# Patient Record
Sex: Female | Born: 1990 | Hispanic: Yes | Marital: Married | Smoking: Never smoker
Health system: Southern US, Community
[De-identification: ages and names within clinical notes are randomized; demographics above are authoritative.]

---
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
## 2009-04-12 ENCOUNTER — Ambulatory Visit: Payer: Self-pay | Admitting: Gastroenterology

## 2009-05-10 ENCOUNTER — Ambulatory Visit: Payer: Self-pay | Admitting: Gastroenterology

## 2013-07-01 ENCOUNTER — Emergency Department: Payer: Self-pay | Admitting: Emergency Medicine

## 2013-07-01 LAB — URINALYSIS, COMPLETE
Bilirubin,UR: NEGATIVE
Glucose,UR: NEGATIVE mg/dL (ref 0–75)
KETONE: NEGATIVE
LEUKOCYTE ESTERASE: NEGATIVE
NITRITE: NEGATIVE
PH: 7 (ref 4.5–8.0)
PROTEIN: NEGATIVE
RBC,UR: 3 /HPF (ref 0–5)
SPECIFIC GRAVITY: 1.02 (ref 1.003–1.030)
Squamous Epithelial: 6
WBC UR: 3 /HPF (ref 0–5)

## 2013-07-01 LAB — PREGNANCY, URINE: Pregnancy Test, Urine: NEGATIVE m[IU]/mL

## 2013-07-01 LAB — GC/CHLAMYDIA PROBE AMP

## 2013-07-01 LAB — WET PREP, GENITAL

## 2013-07-03 LAB — URINE CULTURE

## 2014-08-30 ENCOUNTER — Other Ambulatory Visit: Payer: Self-pay

## 2014-08-30 ENCOUNTER — Emergency Department
Admission: EM | Admit: 2014-08-30 | Discharge: 2014-08-30 | Disposition: A | Discharge status: Home / Self Care | Payer: Medicaid Other | Attending: Emergency Medicine | Admitting: Emergency Medicine

## 2014-08-30 ENCOUNTER — Emergency Department
Admission: EM | Admit: 2014-08-30 | Discharge: 2014-08-30 | Disposition: A | Discharge status: Home / Self Care | Payer: Self-pay

## 2014-08-30 ENCOUNTER — Encounter: Payer: Self-pay | Admitting: Emergency Medicine

## 2014-08-30 DIAGNOSIS — Z3202 Encounter for pregnancy test, result negative: Secondary | ICD-10-CM | POA: Insufficient documentation

## 2014-08-30 DIAGNOSIS — R55 Syncope and collapse: Secondary | ICD-10-CM | POA: Insufficient documentation

## 2014-08-30 LAB — URINALYSIS COMPLETE WITH MICROSCOPIC (ARMC ONLY)
Bacteria, UA: NONE SEEN
Bilirubin Urine: NEGATIVE
GLUCOSE, UA: NEGATIVE mg/dL
Nitrite: NEGATIVE
PROTEIN: NEGATIVE mg/dL
SPECIFIC GRAVITY, URINE: 1.012 (ref 1.005–1.030)
pH: 5 (ref 5.0–8.0)

## 2014-08-30 LAB — BASIC METABOLIC PANEL
Anion gap: 7 (ref 5–15)
BUN: 12 mg/dL (ref 6–20)
CO2: 26 mmol/L (ref 22–32)
Calcium: 9.1 mg/dL (ref 8.9–10.3)
Chloride: 104 mmol/L (ref 101–111)
Creatinine, Ser: 0.48 mg/dL (ref 0.44–1.00)
GLUCOSE: 97 mg/dL (ref 65–99)
POTASSIUM: 3.7 mmol/L (ref 3.5–5.1)
Sodium: 137 mmol/L (ref 135–145)

## 2014-08-30 LAB — CBC
HEMATOCRIT: 41 % (ref 35.0–47.0)
Hemoglobin: 13.8 g/dL (ref 12.0–16.0)
MCH: 31 pg (ref 26.0–34.0)
MCHC: 33.6 g/dL (ref 32.0–36.0)
MCV: 92.4 fL (ref 80.0–100.0)
PLATELETS: 270 10*3/uL (ref 150–440)
RBC: 4.44 MIL/uL (ref 3.80–5.20)
RDW: 13.3 % (ref 11.5–14.5)
WBC: 8.9 10*3/uL (ref 3.6–11.0)

## 2014-08-30 LAB — POCT PREGNANCY, URINE: Preg Test, Ur: NEGATIVE

## 2014-08-30 NOTE — ED Notes (Signed)
Discharge instructions reviewed with patient. Questions fielded by this RN. Patient verbalizes understanding of instructions. Patient discharged home in stable condition per Aimee Bowers. No acute distress noted at time of discharge.

## 2014-08-30 NOTE — ED Provider Notes (Signed)
[facility] Emergency Department Provider Note    ____________________________________________  Time seen: Approximately 1900  I have reviewed the triage vital signs and the nursing notes.   HISTORY  Chief Complaint Near Syncope       HPI Aimee Bowers is a 24 y.o. female with no significant past medical history who presents with an episode of near syncope and preceding symptoms that include lightheadedness, diaphoresis, nausea, and mild headache. She had been out in the heat for some period of time and had been seated. She stood up to get out of a car when she felt the rapid onset of symptoms. She reports that she felt better after having some cold water splashed in her face. She did not completely lose consciousness. She feels back to normal at this time except for a mild headache. She did not vomit, and she denies chest pain, shortness of breath, abdominal pain, and recent dysuria.     History reviewed. No pertinent past medical history.  There are no active problems to display for this patient.   History reviewed. No pertinent past surgical history.  No current outpatient prescriptions on file.  Allergies Review of patient's allergies indicates not on file.  History reviewed. No pertinent family history.  Social History History  Substance Use Topics  . Smoking status: Never Smoker   . Smokeless tobacco: Not on file  . Alcohol Use: No    Review of Systems  Constitutional: Negative for fever. Eyes: Negative for visual changes. ENT: Negative for sore throat. Cardiovascular: Negative for chest pain. Respiratory: Negative for shortness of breath. Gastrointestinal: Mild nausea, no vomiting. Nausea has resolved  Genitourinary: Negative for dysuria. Musculoskeletal: Negative for back pain. Skin: Negative for rash. Neurological: Mild headache. No focal weakness or numbness.   10-point ROS otherwise  negative.  ____________________________________________   PHYSICAL EXAM:  VITAL SIGNS: ED Triage Vitals  Enc Vitals Group     BP 08/30/14 1744 125/80 mmHg     Pulse Rate 08/30/14 1744 80     Resp 08/30/14 1744 18     Temp 08/30/14 1744 98.2 F (36.8 C)     Temp Source 08/30/14 1744 Oral     SpO2 08/30/14 1828 100 %     Weight 08/30/14 1744 128 lb (58.06 kg)     Height 08/30/14 1744 5\' 2"  (1.575 m)     Head Cir --      Peak Flow --      Pain Score 08/30/14 1747 8     Pain Loc --      Pain Edu? --      Excl. in GC? --      Constitutional: Alert and oriented. Well appearing and in no distress. Eyes: Conjunctivae are normal. PERRL. Normal extraocular movements. ENT   Head: Normocephalic and atraumatic.   Nose: No congestion/rhinnorhea.   Mouth/Throat: Mucous membranes are moist.   Neck: No stridor. Hematological/Lymphatic/Immunilogical: No cervical lymphadenopathy. Cardiovascular: Normal rate, regular rhythm. Normal and symmetric distal pulses are present in all extremities. No murmurs, rubs, or gallops. Respiratory: Normal respiratory effort without tachypnea nor retractions. Breath sounds are clear and equal bilaterally. No wheezes/rales/rhonchi. Gastrointestinal: Soft and nontender. No distention. No abdominal bruits. There is no CVA tenderness. Genitourinary: Deferred Musculoskeletal: Nontender with normal range of motion in all extremities. No joint effusions.  No lower extremity tenderness nor edema. Neurologic:  Normal speech and language. No gross focal neurologic deficits are appreciated. Speech is normal. No gait instability. Skin:  Skin  is warm, dry and intact. No rash noted. Psychiatric: Mood and affect are normal. Speech and behavior are normal. Patient exhibits appropriate insight and judgment.  ____________________________________________    LABS (pertinent positives/negatives)  Reviewed and unremarkable. Urine pregnancy is  negative  ____________________________________________   EKG   Date: 08/30/2014  Rate: 73  Rhythm: normal sinus rhythm  QRS Axis: normal  Intervals: normal  ST/T Wave abnormalities: normal  Conduction Disutrbances: none  Narrative Interpretation: unremarkable      ____________________________________________    RADIOLOGY  Not indicated  ____________________________________________   PROCEDURES  Procedure(s) performed: None  Critical Care performed: No  ____________________________________________   INITIAL IMPRESSION / ASSESSMENT AND PLAN / ED COURSE  Pertinent labs & imaging results that were available during my care of the patient were reviewed by me and considered in my medical decision making (see chart for details).  The patient is in no acute distress, well-appearing, and back to baseline except for a mild headache. She had a near syncopal episode that was likely orthostatic in nature. She has no sign of infection or other emergent medical condition. I discussed the patient's results with her and her significant other and she will follow-up with her regular doctor within the next couple of days. I recommended drinking plenty of by mouth fluids for rehydration, but there is no indication for IV hydration at this time. I gave my usual and customary return precautions.  ____________________________________________   FINAL CLINICAL IMPRESSION(S) / ED DIAGNOSES  Final diagnoses:  Near syncope     Loleta Rose, MD 08/30/14 2000

## 2014-08-30 NOTE — ED Notes (Signed)
Pt states she had sudden onset dizziness, diaphoresis with nausea and HA that started this afternoon..[facility]..

## 2014-08-30 NOTE — Discharge Instructions (Signed)
Near-Syncope Near-syncope (commonly known as near fainting) is sudden weakness, dizziness, or feeling like you might pass out. During an episode of near-syncope, you may also develop pale skin, have tunnel vision, or feel sick to your stomach (nauseous). Near-syncope may occur when getting up after sitting or while standing for a long time. It is caused by a sudden decrease in blood flow to the brain. This decrease can result from various causes or triggers, most of which are not serious. However, because near-syncope can sometimes be a sign of something serious, a medical evaluation is required. The specific cause is often not determined. HOME CARE INSTRUCTIONS  Monitor your condition for any changes. The following actions may help to alleviate any discomfort you are experiencing:  Have someone stay with you until you feel stable.  Lie down right away and prop your feet up if you start feeling like you might faint. Breathe deeply and steadily. Wait until all the symptoms have passed. Most of these episodes last only a few minutes. You may feel tired for several hours.   Drink enough fluids to keep your urine clear or pale yellow.   If you are taking blood pressure or heart medicine, get up slowly when seated or lying down. Take several minutes to sit and then stand. This can reduce dizziness.  Follow up with your health care provider as directed. SEEK IMMEDIATE MEDICAL CARE IF:   You have a severe headache.   You have unusual pain in the chest, abdomen, or back.   You are bleeding from the mouth or rectum, or you have black or tarry stool.   You have an irregular or very fast heartbeat.   You have repeated fainting or have seizure-like jerking during an episode.   You faint when sitting or lying down.   You have confusion.   You have difficulty walking.   You have severe weakness.   You have vision problems.  MAKE SURE YOU:   Understand these instructions.  Will  watch your condition.  Will get help right away if you are not doing well or get worse. Document Released: 04/16/2005 Document Revised: 04/21/2013 Document Reviewed: 09/19/2012 Exit[employer] Patient Information 2015 [employer]. This information is not intended to replace advice given to you by your health care provider. Make sure you discuss any questions you have with your health care provider.  

## 2015-07-01 ENCOUNTER — Other Ambulatory Visit: Payer: Self-pay | Admitting: Neurology

## 2015-07-01 DIAGNOSIS — R292 Abnormal reflex: Secondary | ICD-10-CM

## 2015-07-01 DIAGNOSIS — R200 Anesthesia of skin: Secondary | ICD-10-CM

## 2015-07-01 DIAGNOSIS — M79601 Pain in right arm: Secondary | ICD-10-CM

## 2015-07-01 DIAGNOSIS — M79602 Pain in left arm: Secondary | ICD-10-CM

## 2015-07-01 DIAGNOSIS — G8929 Other chronic pain: Secondary | ICD-10-CM

## 2015-07-01 DIAGNOSIS — R202 Paresthesia of skin: Secondary | ICD-10-CM

## 2015-07-01 DIAGNOSIS — M542 Cervicalgia: Secondary | ICD-10-CM

## 2015-07-21 ENCOUNTER — Ambulatory Visit: Payer: 59

## 2015-08-01 ENCOUNTER — Ambulatory Visit (HOSPITAL_COMMUNITY): Admission: RE | Admit: 2015-08-01 | Payer: 59 | Source: Ambulatory Visit

## 2015-08-01 ENCOUNTER — Ambulatory Visit (HOSPITAL_COMMUNITY): Payer: 59

## 2015-08-02 ENCOUNTER — Ambulatory Visit (HOSPITAL_COMMUNITY)
Admission: RE | Admit: 2015-08-02 | Discharge: 2015-08-02 | Disposition: A | Discharge status: Home / Self Care | Payer: 59 | Source: Ambulatory Visit | Attending: Neurology | Admitting: Neurology

## 2015-08-02 DIAGNOSIS — R292 Abnormal reflex: Secondary | ICD-10-CM | POA: Diagnosis not present

## 2015-08-02 DIAGNOSIS — R202 Paresthesia of skin: Secondary | ICD-10-CM

## 2015-08-02 DIAGNOSIS — G8929 Other chronic pain: Secondary | ICD-10-CM | POA: Insufficient documentation

## 2015-08-02 DIAGNOSIS — M542 Cervicalgia: Secondary | ICD-10-CM | POA: Diagnosis not present

## 2015-08-02 DIAGNOSIS — M50222 Other cervical disc displacement at C5-C6 level: Secondary | ICD-10-CM | POA: Insufficient documentation

## 2015-08-02 DIAGNOSIS — M79601 Pain in right arm: Secondary | ICD-10-CM | POA: Diagnosis present

## 2015-08-02 DIAGNOSIS — R200 Anesthesia of skin: Secondary | ICD-10-CM

## 2015-08-02 DIAGNOSIS — M79602 Pain in left arm: Secondary | ICD-10-CM | POA: Diagnosis present

## 2015-08-02 MED ORDER — GADOBENATE DIMEGLUMINE 529 MG/ML IV SOLN
10.0000 mL | Freq: Once | INTRAVENOUS | Status: AC | PRN
  Administered 2015-08-02: 10 mL via INTRAVENOUS

## 2016-01-08 IMAGING — CT CT STONE STUDY
1 of 4 series · 4 of 46 positions shown, 9 images · non-contrast
Comparison: None.

CLINICAL DATA: Frequent UTIs and flank pain.  Left flank pain.

EXAM:
CT ABDOMEN AND PELVIS WITHOUT CONTRAST
TECHNIQUE: Multidetector CT imaging of the abdomen and pelvis was performed
following the standard protocol without IV contrast.

[Series 4: lung windows · axial · 0.68mm/px · z∈[-650,-590]mm · 4 of 20 slices shown, 9 images]
[im 4/20  soft-tissue]
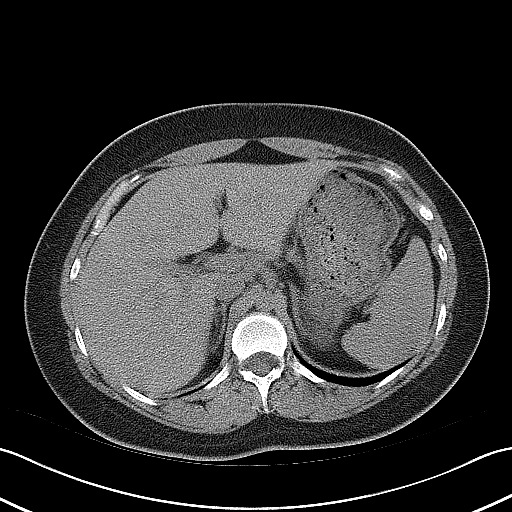
[im 4/20  lung]
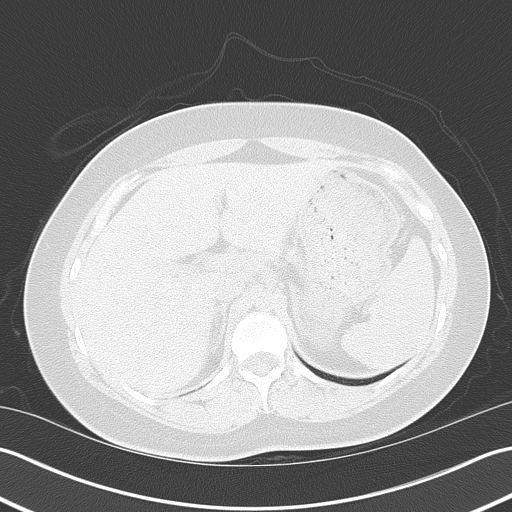
[im 4/20  bone]
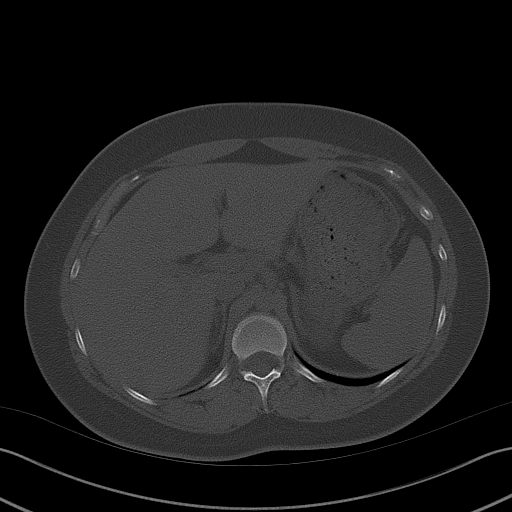
[im 8/20  soft-tissue]
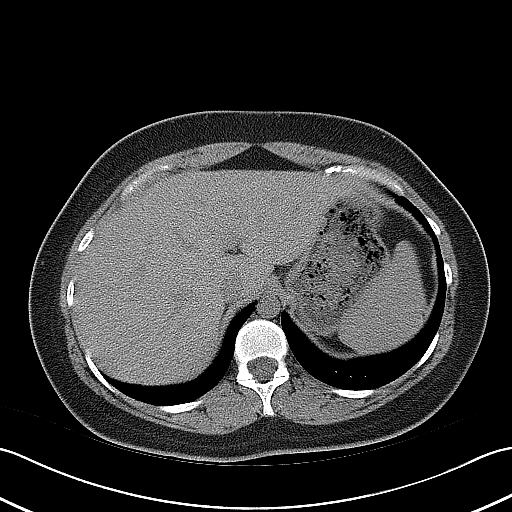
[im 8/20  lung]
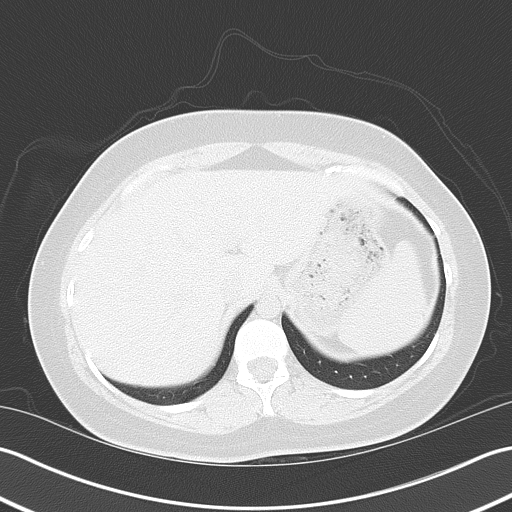
[im 12/20  soft-tissue]
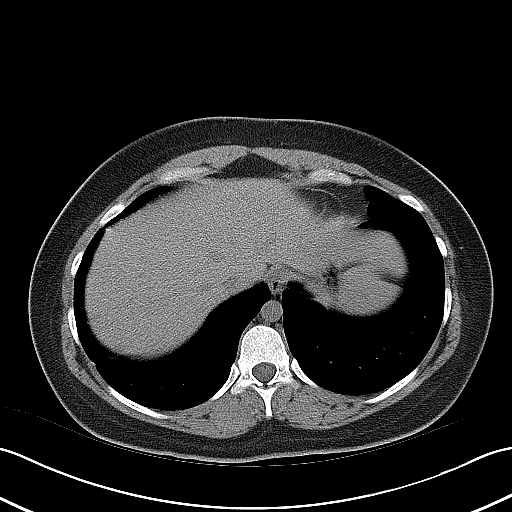
[im 12/20  lung]
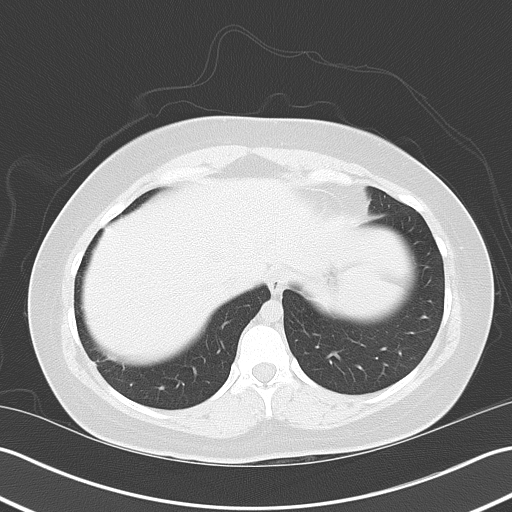
[im 16/20  soft-tissue]
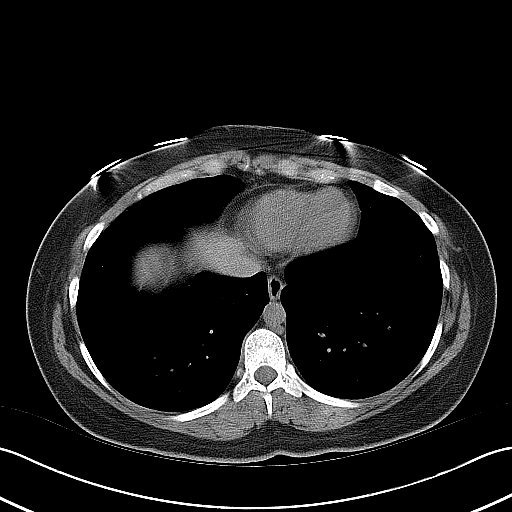
[im 16/20  lung]
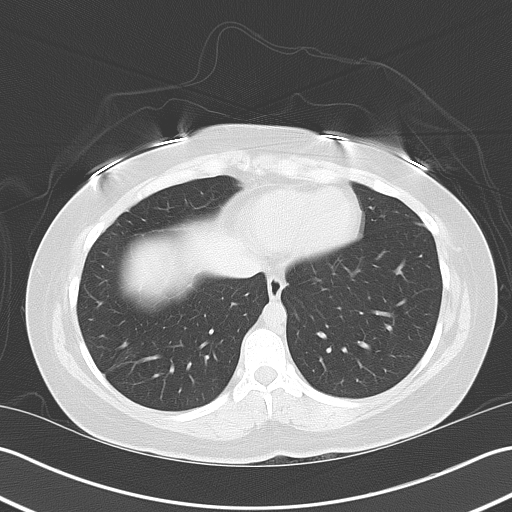

[4 of 46 positions shown; findings below may reference images not displayed]

FINDINGS: BODY WALL: Unremarkable.

LOWER CHEST: Unremarkable.

ABDOMEN/PELVIS:

Liver: No focal abnormality.

Biliary: No evidence of biliary obstruction or stone.

Pancreas: Unremarkable.

Spleen: Unremarkable.

Adrenals: Unremarkable.

Kidneys and ureters: No hydronephrosis or stone.

Bladder: Unremarkable.

Reproductive: Unremarkable.

Bowel: No obstruction. Normal appendix.

Retroperitoneum: No mass or adenopathy.

Peritoneum: No free fluid or gas.

Vascular: No acute abnormality.

OSSEOUS: No acute abnormalities.
IMPRESSION: No hydronephrosis, nephrolithiasis, or other abnormality to explain
left flank pain.

## 2017-06-10 ENCOUNTER — Other Ambulatory Visit: Payer: Self-pay | Admitting: Family Medicine

## 2017-06-10 DIAGNOSIS — R102 Pelvic and perineal pain: Secondary | ICD-10-CM

## 2017-06-12 ENCOUNTER — Ambulatory Visit
Admission: RE | Admit: 2017-06-12 | Discharge: 2017-06-12 | Disposition: A | Discharge status: Home / Self Care | Payer: 59 | Source: Ambulatory Visit | Attending: Family Medicine | Admitting: Family Medicine

## 2017-06-12 DIAGNOSIS — R102 Pelvic and perineal pain: Secondary | ICD-10-CM | POA: Insufficient documentation

## 2017-06-12 DIAGNOSIS — R188 Other ascites: Secondary | ICD-10-CM | POA: Diagnosis not present

## 2017-06-13 ENCOUNTER — Other Ambulatory Visit: Payer: Self-pay

## 2017-09-06 ENCOUNTER — Other Ambulatory Visit: Payer: Self-pay | Admitting: Family Medicine

## 2017-09-06 DIAGNOSIS — Z3201 Encounter for pregnancy test, result positive: Secondary | ICD-10-CM

## 2017-09-12 ENCOUNTER — Ambulatory Visit: Payer: 59

## 2017-09-18 ENCOUNTER — Other Ambulatory Visit: Payer: Self-pay | Admitting: Family Medicine

## 2017-09-18 DIAGNOSIS — Z3201 Encounter for pregnancy test, result positive: Secondary | ICD-10-CM

## 2017-09-19 ENCOUNTER — Other Ambulatory Visit: Payer: Self-pay | Admitting: Family Medicine

## 2017-09-19 ENCOUNTER — Ambulatory Visit
Admission: RE | Admit: 2017-09-19 | Discharge: 2017-09-19 | Disposition: A | Discharge status: Home / Self Care | Payer: 59 | Source: Ambulatory Visit | Attending: Family Medicine | Admitting: Family Medicine

## 2017-09-19 DIAGNOSIS — Z3A01 Less than 8 weeks gestation of pregnancy: Secondary | ICD-10-CM | POA: Diagnosis not present

## 2017-09-19 DIAGNOSIS — Z3201 Encounter for pregnancy test, result positive: Secondary | ICD-10-CM | POA: Insufficient documentation

## 2018-02-03 ENCOUNTER — Emergency Department: Payer: 59

## 2018-02-03 ENCOUNTER — Other Ambulatory Visit: Payer: Self-pay

## 2018-02-03 ENCOUNTER — Emergency Department
Admission: EM | Admit: 2018-02-03 | Discharge: 2018-02-03 | Disposition: A | Discharge status: Home / Self Care | Payer: 59 | Attending: Emergency Medicine | Admitting: Emergency Medicine

## 2018-02-03 DIAGNOSIS — O26899 Other specified pregnancy related conditions, unspecified trimester: Secondary | ICD-10-CM

## 2018-02-03 DIAGNOSIS — B9689 Other specified bacterial agents as the cause of diseases classified elsewhere: Secondary | ICD-10-CM

## 2018-02-03 DIAGNOSIS — R110 Nausea: Secondary | ICD-10-CM | POA: Diagnosis not present

## 2018-02-03 DIAGNOSIS — O9989 Other specified diseases and conditions complicating pregnancy, childbirth and the puerperium: Secondary | ICD-10-CM | POA: Diagnosis not present

## 2018-02-03 DIAGNOSIS — R197 Diarrhea, unspecified: Secondary | ICD-10-CM | POA: Diagnosis not present

## 2018-02-03 DIAGNOSIS — R109 Unspecified abdominal pain: Secondary | ICD-10-CM

## 2018-02-03 DIAGNOSIS — O23591 Infection of other part of genital tract in pregnancy, first trimester: Secondary | ICD-10-CM | POA: Diagnosis not present

## 2018-02-03 DIAGNOSIS — N760 Acute vaginitis: Secondary | ICD-10-CM

## 2018-02-03 LAB — CBC
HCT: 40.7 % (ref 35.0–47.0)
HEMOGLOBIN: 13.9 g/dL (ref 12.0–16.0)
MCH: 31.1 pg (ref 26.0–34.0)
MCHC: 34.1 g/dL (ref 32.0–36.0)
MCV: 91.3 fL (ref 80.0–100.0)
Platelets: 298 10*3/uL (ref 150–440)
RBC: 4.47 MIL/uL (ref 3.80–5.20)
RDW: 12.6 % (ref 11.5–14.5)
WBC: 16 10*3/uL — ABNORMAL HIGH (ref 3.6–11.0)

## 2018-02-03 LAB — URINALYSIS, COMPLETE (UACMP) WITH MICROSCOPIC
BACTERIA UA: NONE SEEN
Bilirubin Urine: NEGATIVE
Glucose, UA: NEGATIVE mg/dL
HGB URINE DIPSTICK: NEGATIVE
Ketones, ur: NEGATIVE mg/dL
Leukocytes, UA: NEGATIVE
Nitrite: NEGATIVE
PROTEIN: NEGATIVE mg/dL
Specific Gravity, Urine: 1.028 (ref 1.005–1.030)
pH: 5 (ref 5.0–8.0)

## 2018-02-03 LAB — COMPREHENSIVE METABOLIC PANEL
ALT: 17 U/L (ref 0–44)
AST: 21 U/L (ref 15–41)
Albumin: 3.9 g/dL (ref 3.5–5.0)
Alkaline Phosphatase: 46 U/L (ref 38–126)
Anion gap: 8 (ref 5–15)
BUN: 11 mg/dL (ref 6–20)
CHLORIDE: 106 mmol/L (ref 98–111)
CO2: 21 mmol/L — AB (ref 22–32)
Calcium: 8.7 mg/dL — ABNORMAL LOW (ref 8.9–10.3)
Creatinine, Ser: 0.38 mg/dL — ABNORMAL LOW (ref 0.44–1.00)
GFR calc Af Amer: >60 mL/min (ref >60)
GFR calc non Af Amer: >60 mL/min (ref >60)
Glucose, Bld: 119 mg/dL — ABNORMAL HIGH (ref 70–99)
Potassium: 3.5 mmol/L (ref 3.5–5.1)
SODIUM: 135 mmol/L (ref 135–145)
Total Bilirubin: 0.3 mg/dL (ref 0.3–1.2)
Total Protein: 7.5 g/dL (ref 6.5–8.1)

## 2018-02-03 LAB — WET PREP, GENITAL
Sperm: NONE SEEN
Trich, Wet Prep: NONE SEEN
Yeast Wet Prep HPF POC: NONE SEEN

## 2018-02-03 LAB — HCG, QUANTITATIVE, PREGNANCY: hCG, Beta Chain, Quant, S: 80335 m[IU]/mL — ABNORMAL HIGH (ref <5)

## 2018-02-03 LAB — LIPASE, BLOOD: LIPASE: 41 U/L (ref 11–51)

## 2018-02-03 LAB — CHLAMYDIA/NGC RT PCR (ARMC ONLY)
CHLAMYDIA TR: NOT DETECTED
N GONORRHOEAE: NOT DETECTED

## 2018-02-03 NOTE — ED Provider Notes (Signed)
[facility] Emergency Department Provider Note  ___________________________________________   First MD Initiated Contact with Patient 02/03/18 [phone]     (approximate)  I have reviewed the triage vital signs and the nursing notes.   HISTORY  Chief Complaint Abdominal Pain   HPI Aimee Bowers is a 27 y.o. female who is a G3, P1 at approximately [redacted] weeks gestation who is presenting with nausea and black diarrhea over the past 24 hours.  Last stool at approximately 12:30 AM today.  Says that she is having a 6 out of 10 left lower quadrant abdominal pain.  No recent travel, antibiotics or known sick contacts.  Denies any vaginal bleeding or discharge.   History reviewed. No pertinent past medical history.  There are no active problems to display for this patient.   History reviewed. No pertinent surgical history.  Prior to Admission medications   Not on File    Allergies Patient has no known allergies.  No family history on file.  Social History Social History   Tobacco Use  . Smoking status: Never Smoker  Substance Use Topics  . Alcohol use: No  . Drug use: Not on file    Review of Systems  Constitutional: No fever/chills Eyes: No visual changes. ENT: No sore throat. Cardiovascular: Denies chest pain. Respiratory: Denies shortness of breath. Gastrointestinal:  no vomiting.  No constipation. Genitourinary: Negative for dysuria. Musculoskeletal: Negative for back pain. Skin: Negative for rash. Neurological: Negative for headaches, focal weakness or numbness.   ____________________________________________   PHYSICAL EXAM:  VITAL SIGNS: ED Triage Vitals  Enc Vitals Group     BP 02/03/18 0159 111/66     Pulse Rate 02/03/18 0159 69     Resp 02/03/18 0159 16     Temp 02/03/18 0159 98.1 F (36.7 C)     Temp Source 02/03/18 0159 Oral     SpO2 02/03/18 0159 98 %     Weight 02/03/18 0200 143 lb (64.9 kg)     Height 02/03/18 0200  4\' 11"  (1.499 m)     Head Circumference --      Peak Flow --      Pain Score 02/03/18 0200 9     Pain Loc --      Pain Edu? --      Excl. in GC? --     Constitutional: Alert and oriented. Well appearing and in no acute distress. Eyes: Conjunctivae are normal.  Head: Atraumatic. Nose: No congestion/rhinnorhea. Mouth/Throat: Mucous membranes are moist.  Neck: No stridor.   Cardiovascular: Normal rate, regular rhythm. Grossly normal heart sounds.  Respiratory: Normal respiratory effort.  No retractions. Lungs CTAB. Gastrointestinal: Soft with mild left lower quadrant tenderness without any rebound or guarding. No distention. No CVA tenderness.  Digital rectal exam with brown stool which is heme-negative. genitourinary: Normal external exam without any lesions.  Speculum exam with a small amount of cervical mucus.  Bimanual exam with a closed cervical loss.  No CMT.  No uterine or adnexal tenderness nor masses. Musculoskeletal: No lower extremity tenderness nor edema.  No joint effusions. Neurologic:  Normal speech and language. No gross focal neurologic deficits are appreciated. Skin:  Skin is warm, dry and intact. No rash noted. Psychiatric: Mood and affect are normal. Speech and behavior are normal.  ____________________________________________   LABS (all labs ordered are listed, but only abnormal results are displayed)  Labs Reviewed  WET PREP, GENITAL - Abnormal; Notable for the following components:  Result Value   Clue Cells Wet Prep HPF POC PRESENT (*)    WBC, Wet Prep HPF POC RARE (*)    All other components within normal limits  COMPREHENSIVE METABOLIC PANEL - Abnormal; Notable for the following components:   CO2 21 (*)    Glucose, Bld 119 (*)    Creatinine, Ser 0.38 (*)    Calcium 8.7 (*)    All other components within normal limits  CBC - Abnormal; Notable for the following components:   WBC 16.0 (*)    All other components within normal limits  URINALYSIS,  COMPLETE (UACMP) WITH MICROSCOPIC - Abnormal; Notable for the following components:   Color, Urine YELLOW (*)    APPearance CLEAR (*)    All other components within normal limits  HCG, QUANTITATIVE, PREGNANCY - Abnormal; Notable for the following components:   hCG, Beta Chain, Quant, S 80,335 (*)    All other components within normal limits  CHLAMYDIA/NGC RT PCR ([facility] ONLY)  LIPASE, BLOOD   ____________________________________________  EKG   ____________________________________________  RADIOLOGY  Single live IUP at approximately 8 weeks and 2 days based on crown-rump length.  No subchorionic hemorrhage. ____________________________________________   PROCEDURES  Procedure(s) performed:   Procedures  Critical Care performed:   ____________________________________________   INITIAL IMPRESSION / ASSESSMENT AND PLAN / ED COURSE  Pertinent labs & imaging results that were available during my care of the patient were reviewed by me and considered in my medical decision making (see chart for details).  Differential diagnosis includes, but is not limited to, ovarian cyst, ovarian torsion, acute appendicitis, diverticulitis, urinary tract infection/pyelonephritis, endometriosis, bowel obstruction, colitis, renal colic, gastroenteritis, hernia, fibroids, endometriosis, pregnancy related pain including ectopic pregnancy, etc. As part of my medical decision making, I reviewed the following data within the electronic MEDICAL RECORD NUMBER Notes from prior office visits  ----------------------------------------- 6:46 AM on 02/03/2018 -----------------------------------------  Patient without any further episodes of diarrhea.  Hemoccult negative rectal exam.  Says that she is feeling improved.  Reexamined her abdomen and she is soft and nontender throughout.  We discussed the imaging as well as lab results including the positive result for bacterial vaginosis.  However, the  patient was declining any discharge.  Discussed that if the patient is symptomatic at a later date that she may be treated in the second trimester.  Likely viral issue.  Elevated white blood cell count likely reactive secondary to viral issue.  Reassuring repeat abdominal exam.  Patient to be discharged at this time.  Knows to stay hydrated.  Aware of the likely diagnosis and treatment plan willing to comply. ____________________________________________   FINAL CLINICAL IMPRESSION(S) / ED DIAGNOSES  Final diagnoses:  Abdominal pain affecting pregnancy  Nausea and diarrhea.  Bacterial vaginosis.    NEW MEDICATIONS STARTED DURING THIS VISIT:  New Prescriptions   No medications on file     Note:  This document was prepared using Dragon voice recognition software and may include unintentional dictation errors.     Myrna Blazer, MD 02/03/18 [phone]

## 2018-02-03 NOTE — ED Notes (Signed)
Pt updated on delay for ultrasound. Pt verbalizes understanding, lights dimmed for comfort.

## 2018-02-03 NOTE — ED Notes (Addendum)
Patient reports that Saturday night she at [employer] and began to immediately experience stomach discomfort and diarrhea. patient reports diarrhea and stomach discomfort continued Sunday. patient reports decreased appetite Sunday.   Patient c/o lower abdominal discomfort. Patient reports yesterday pain was generalized abdomen  Patient is tender to palpation of epigastric region.  Patient reports over 10 episodes of diarrhea in the last 24 hours. Patient reports that her stool is liquid with coffee grind appearance.   Patient c/o nausea, denies emesis

## 2018-02-03 NOTE — ED Notes (Signed)
Report to [place], rn.

## 2018-02-03 NOTE — ED Notes (Signed)
Assisted md with rectal exam.  

## 2018-02-03 NOTE — ED Notes (Signed)
Pt to ultrasound

## 2018-02-03 NOTE — ED Triage Notes (Signed)
Patient is [redacted] weeks pregnant and is having some abdominal pain located in the lower abdomen associated with nausea and coffee ground emesis tonight around midnight.

## 2018-04-04 ENCOUNTER — Other Ambulatory Visit: Payer: Self-pay | Admitting: Family Medicine

## 2018-04-04 DIAGNOSIS — Z3482 Encounter for supervision of other normal pregnancy, second trimester: Secondary | ICD-10-CM

## 2018-04-16 ENCOUNTER — Ambulatory Visit
Admission: RE | Admit: 2018-04-16 | Discharge: 2018-04-16 | Disposition: A | Discharge status: Home / Self Care | Payer: 59 | Source: Ambulatory Visit | Attending: Family Medicine | Admitting: Family Medicine

## 2018-04-16 DIAGNOSIS — Z3A19 19 weeks gestation of pregnancy: Secondary | ICD-10-CM | POA: Insufficient documentation

## 2018-04-16 DIAGNOSIS — Z3482 Encounter for supervision of other normal pregnancy, second trimester: Secondary | ICD-10-CM | POA: Insufficient documentation

## 2018-04-21 ENCOUNTER — Ambulatory Visit: Payer: Medicaid Other

## 2018-07-05 IMAGING — US US PELVIS COMPLETE TRANSABD/TRANSVAG
1 series · 14 of 25 positions shown · non-contrast
Comparison: No prior.

CLINICAL DATA: Pelvic pain for 2 weeks.

EXAM:
TRANSABDOMINAL AND TRANSVAGINAL ULTRASOUND OF PELVIS
TECHNIQUE: Both transabdominal and transvaginal ultrasound examinations of the
pelvis were performed. Transabdominal technique was performed for
global imaging of the pelvis including uterus, ovaries, adnexal
regions, and pelvic cul-de-sac. It was necessary to proceed with
endovaginal exam following the transabdominal exam to visualize the
uterus and ovaries.

[Series 1: us pelvis complete transabd/transvag · 0.22mm/px · 14 of 106 slices shown]
[im 1/106]
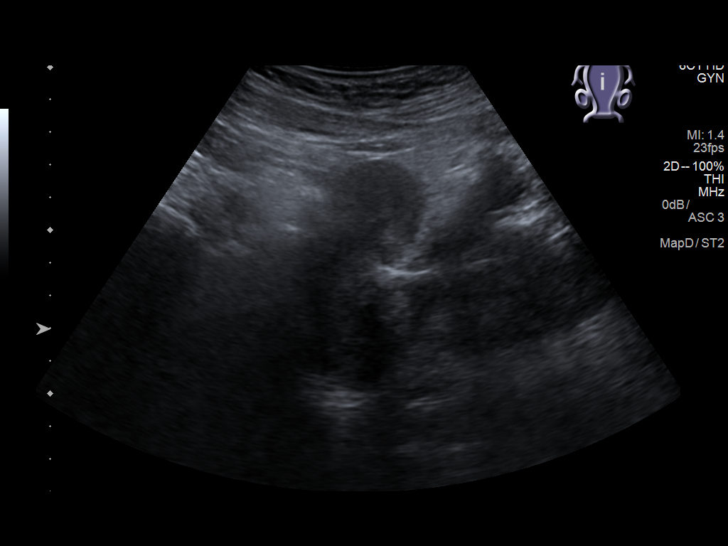
[im 9/106]
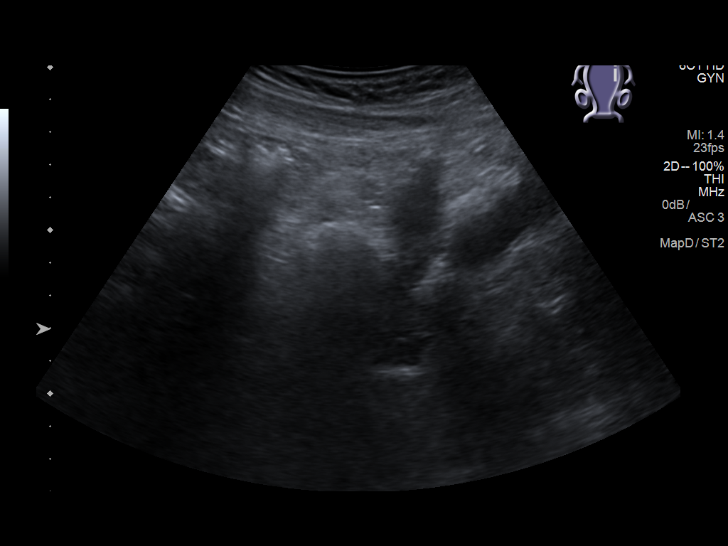
[im 18/106]
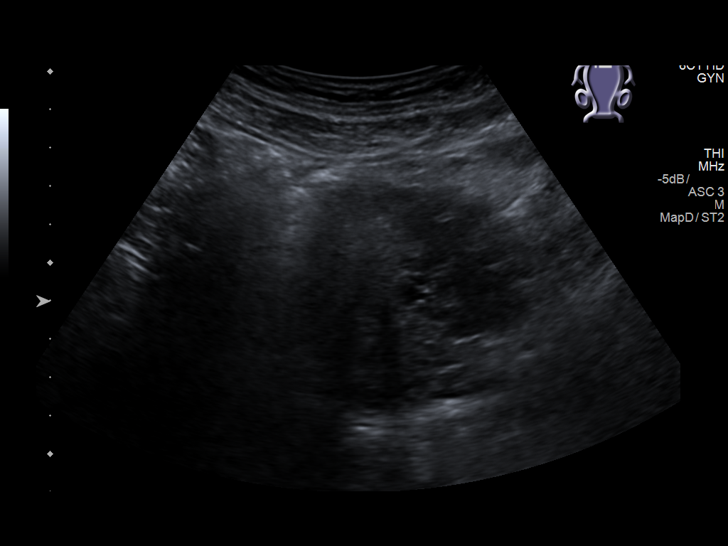
[im 27/106]
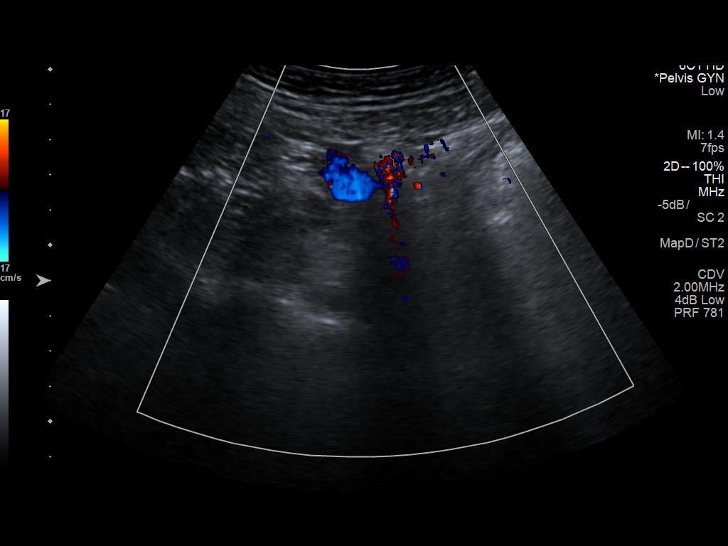
[im 36/106]
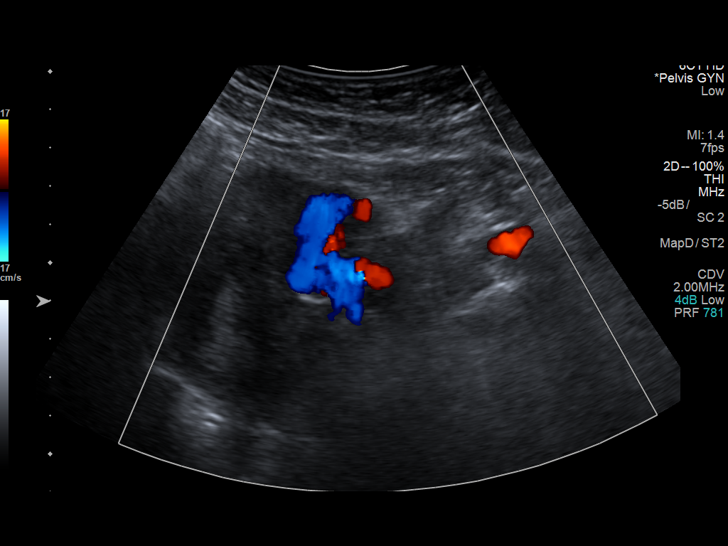
[im 40/106]
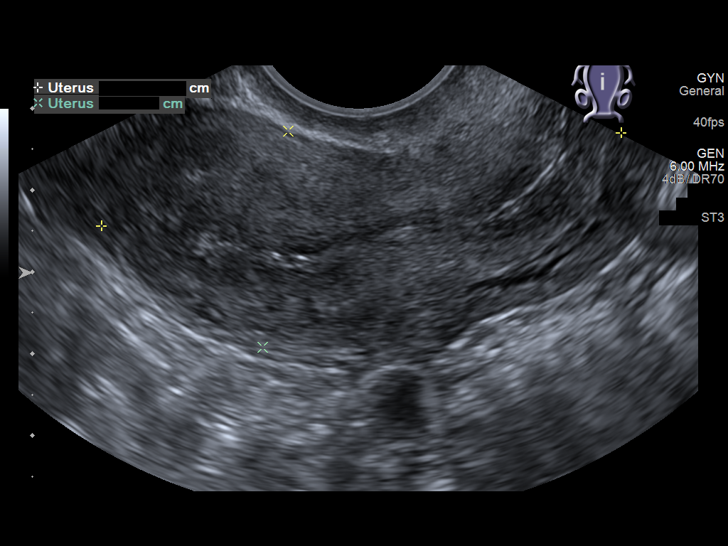
[im 49/106]
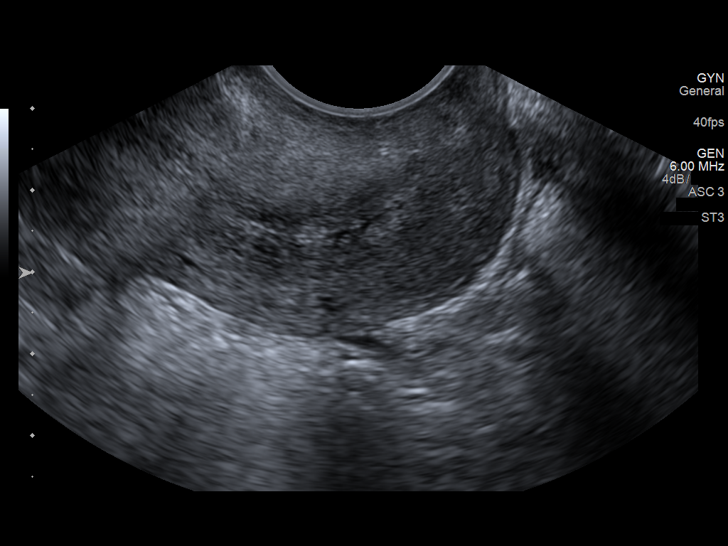
[im 57/106]
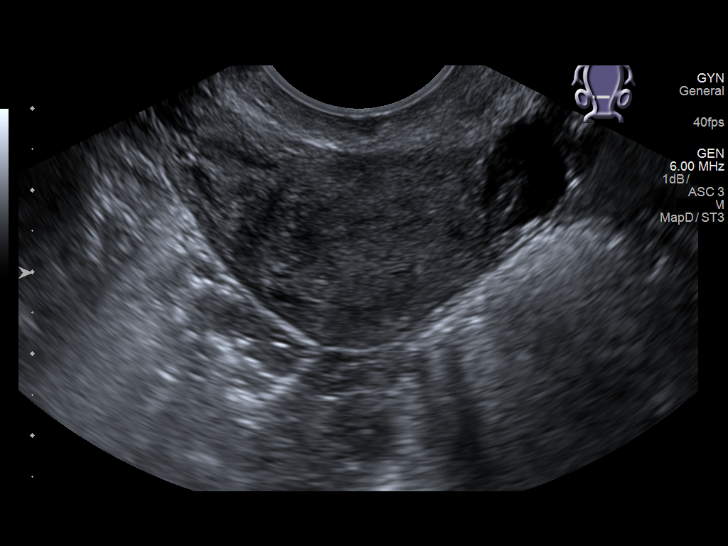
[im 66/106]
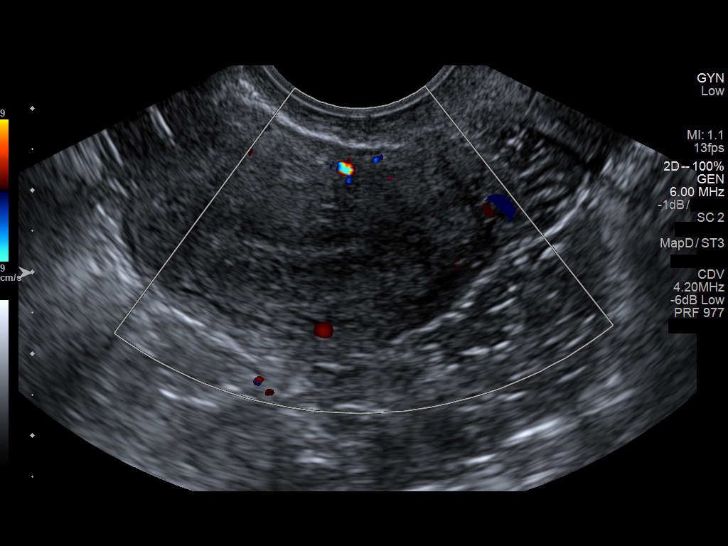
[im 71/106]
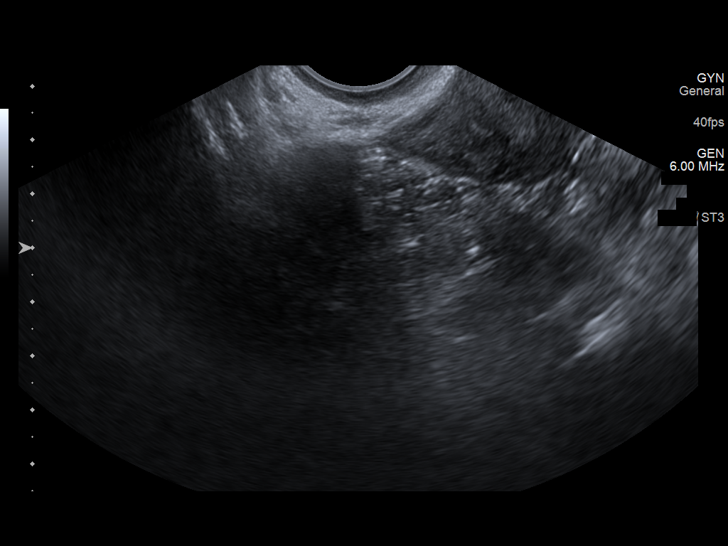
[im 79/106]
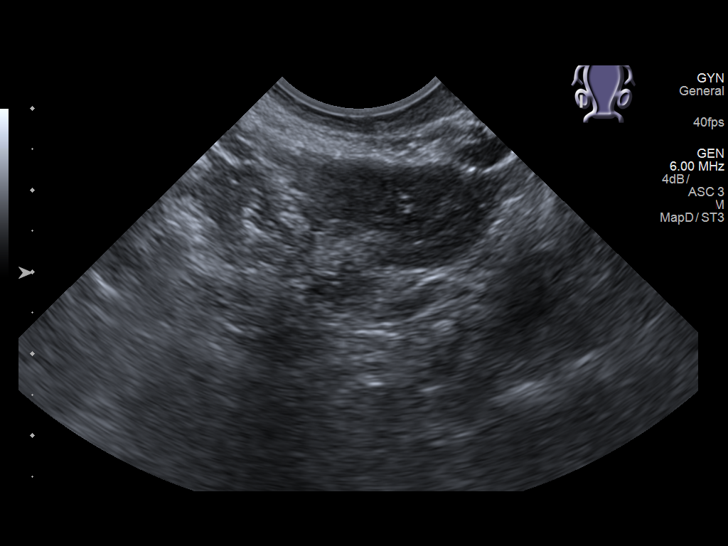
[im 88/106]
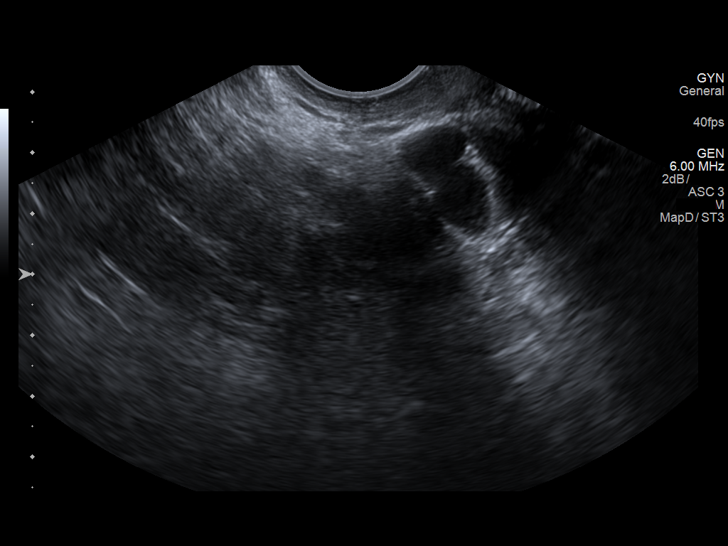
[im 97/106]
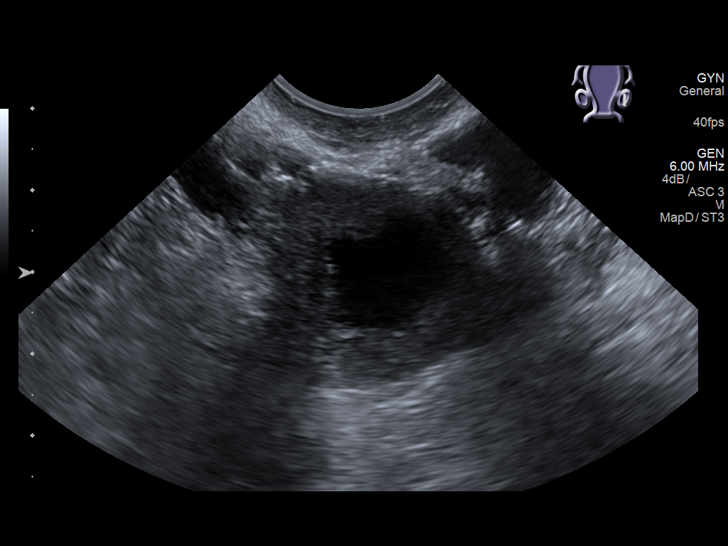
[im 106/106]
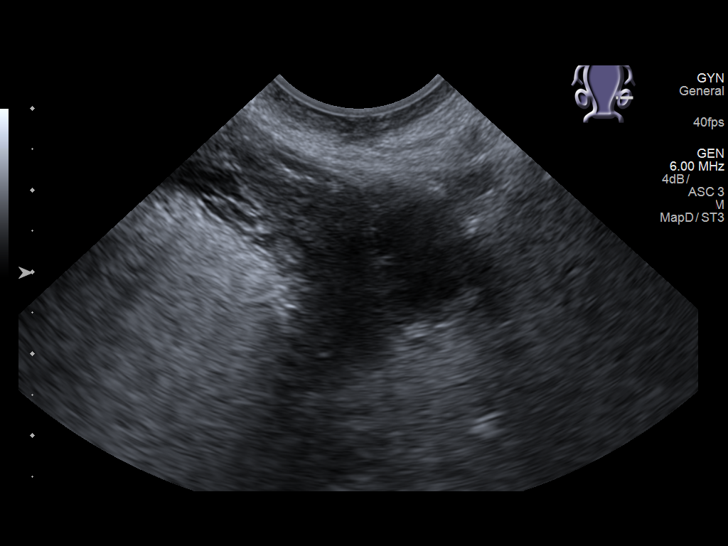

[14 of 25 positions shown; findings below may reference images not displayed]

FINDINGS: Uterus

Measurements: 6.5 x 2.7 x 4.1 cm. No fibroids or other mass
visualized. Trace amount of fluid noted in the cervix.

Endometrium

Thickness: 1.2 mm.  No focal abnormality visualized.

Right ovary

Measurements: 2.5 x 2.4 x 2.0 cm. Normal appearance/no adnexal mass.
Small simple cysts, most likely follicular.

Left ovary

Measurements: 2.9 x 2.5 x 2.1 cm. Normal appearance/no adnexal mass.
Small simple cysts, most likely follicular

Other findings

No abnormal free fluid.
IMPRESSION: Trace amount of fluid in the cervix, exam otherwise unremarkable.

## 2018-11-06 ENCOUNTER — Other Ambulatory Visit: Payer: Self-pay | Admitting: Family Medicine

## 2018-11-06 DIAGNOSIS — Z20822 Contact with and (suspected) exposure to covid-19: Secondary | ICD-10-CM

## 2018-11-11 LAB — NOVEL CORONAVIRUS, NAA: SARS-CoV-2, NAA: NOT DETECTED

## 2020-10-23 IMAGING — US US OB COMP +14 WK
1 series · 13 of 28 positions shown · non-contrast
Comparison: none

CLINICAL DATA: Gestational age by LMP of 18 weeks 5 days. Evaluate
dating and fetal anatomy.

EXAM:
OBSTETRICAL ULTRASOUND >14 WKS

[Series 1: us ob comp +14 wk · 13 of 96 slices shown]
[im 4/96]
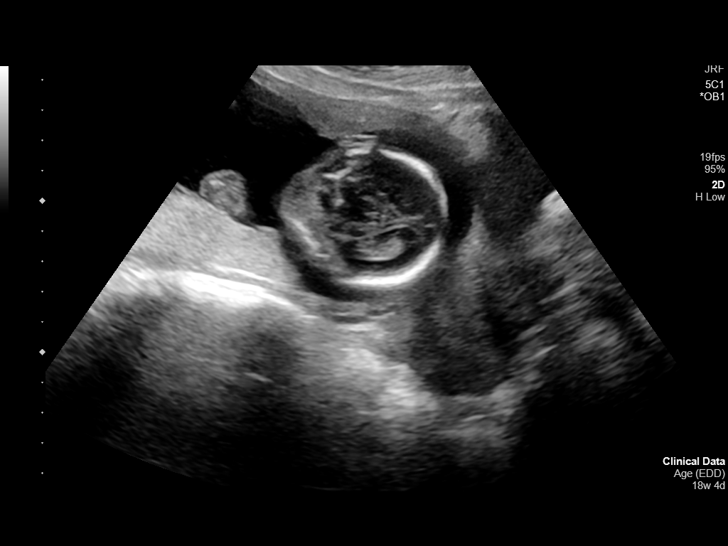
[im 11/96]
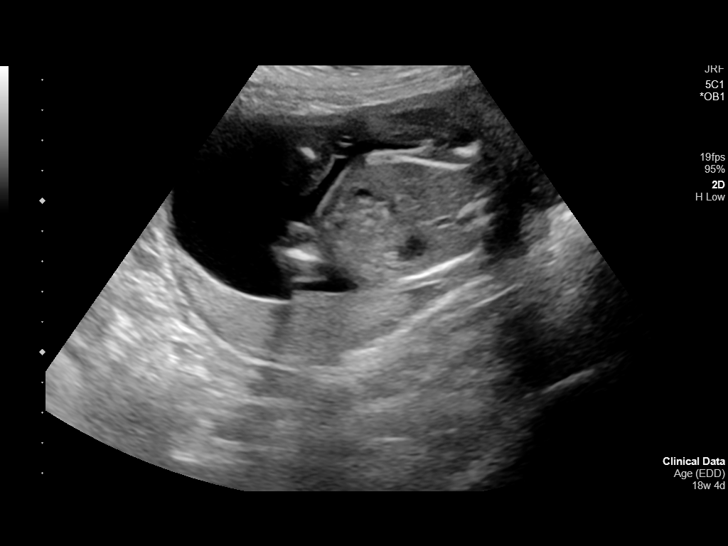
[im 18/96]
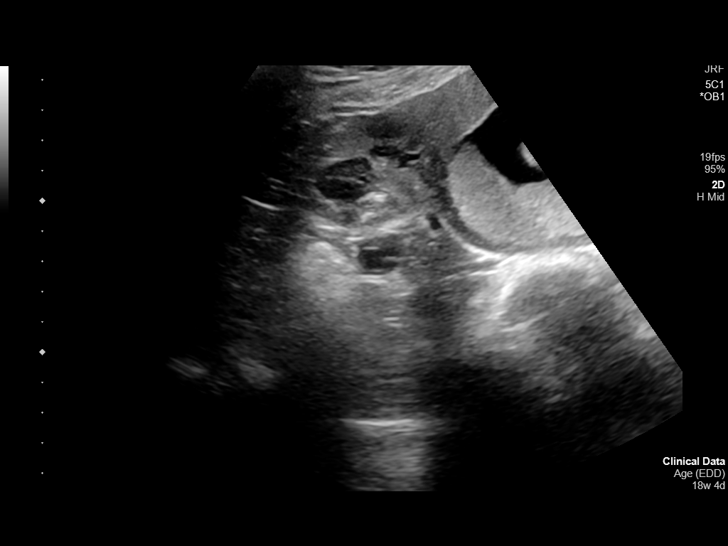
[im 25/96]
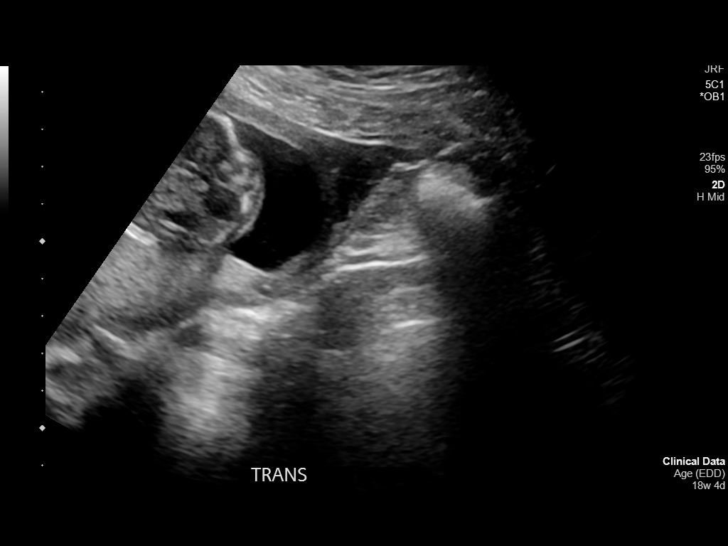
[im 32/96]
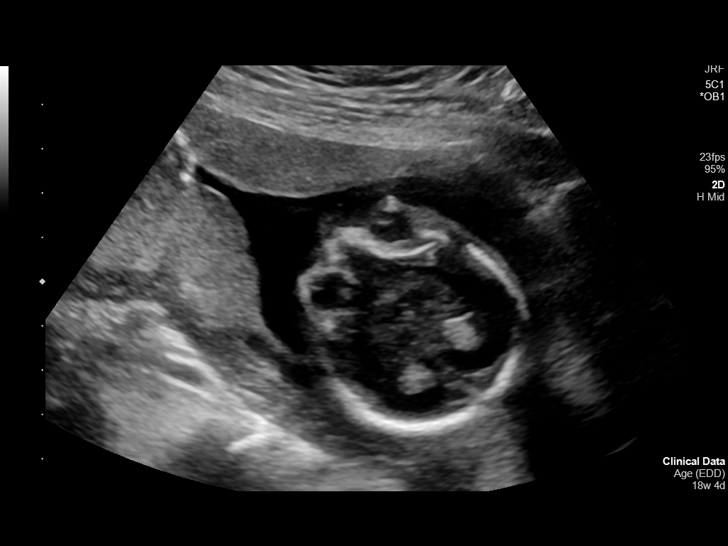
[im 39/96]
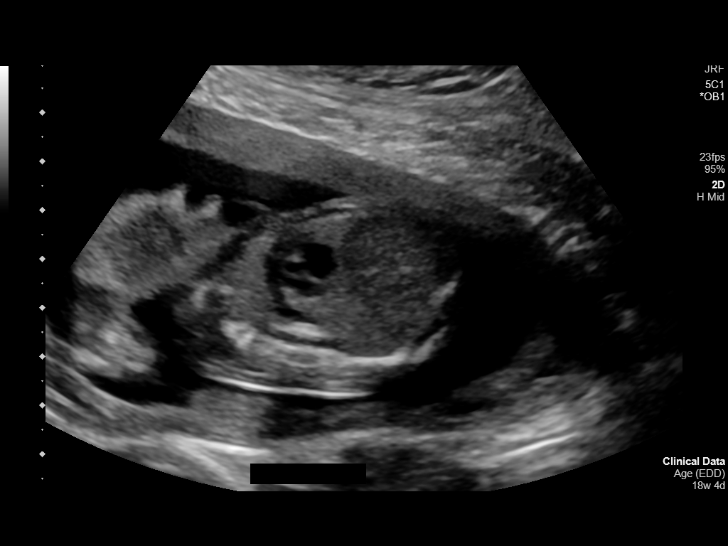
[im 50/96]
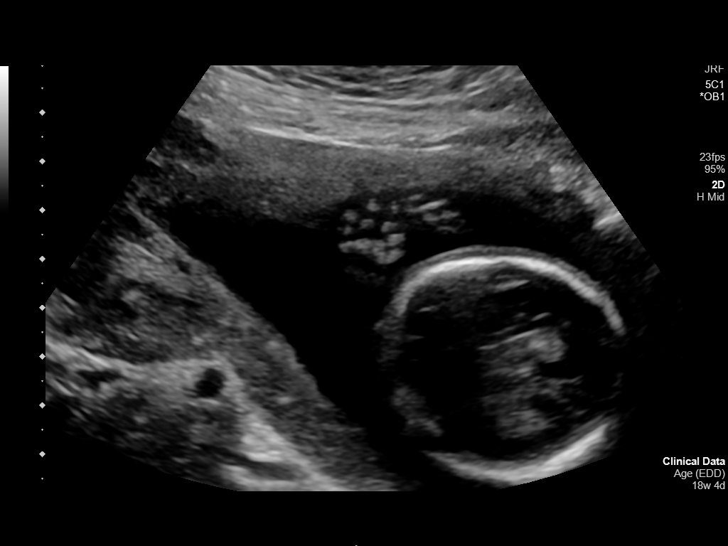
[im 57/96]
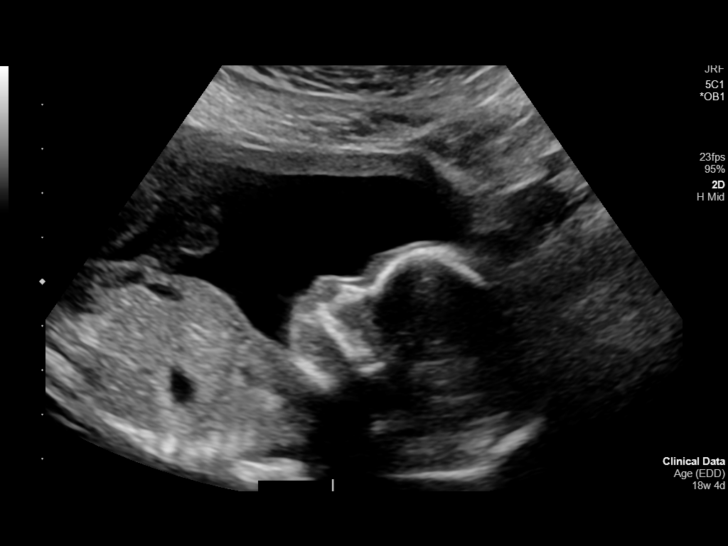
[im 64/96]
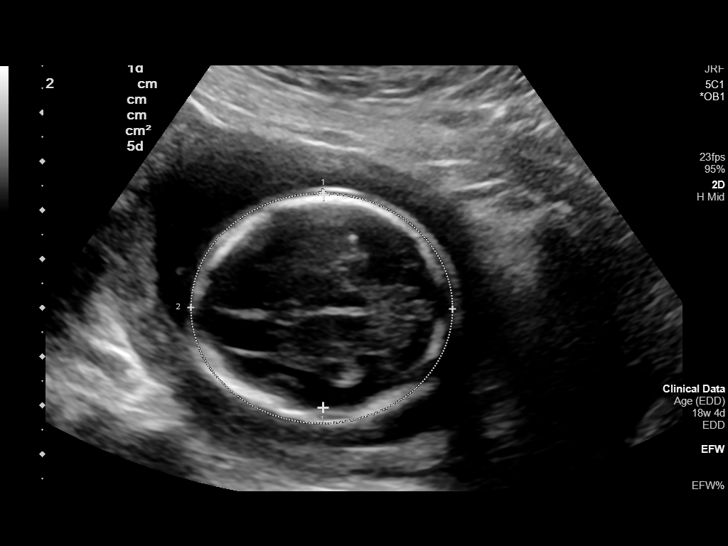
[im 71/96]
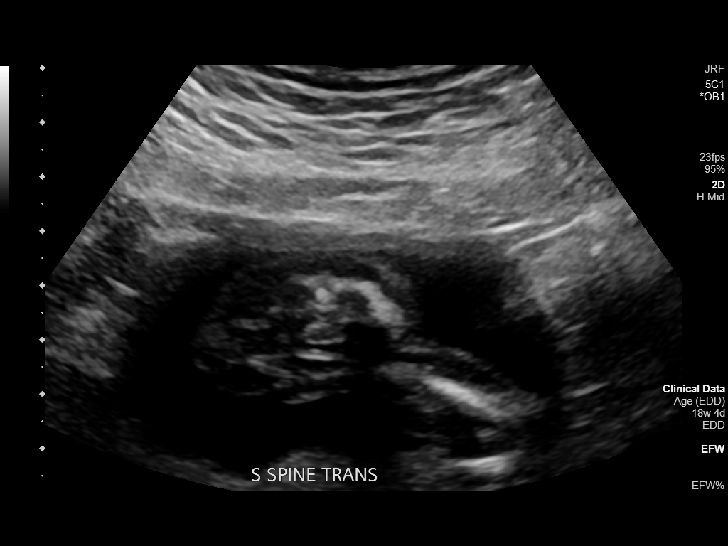
[im 78/96]
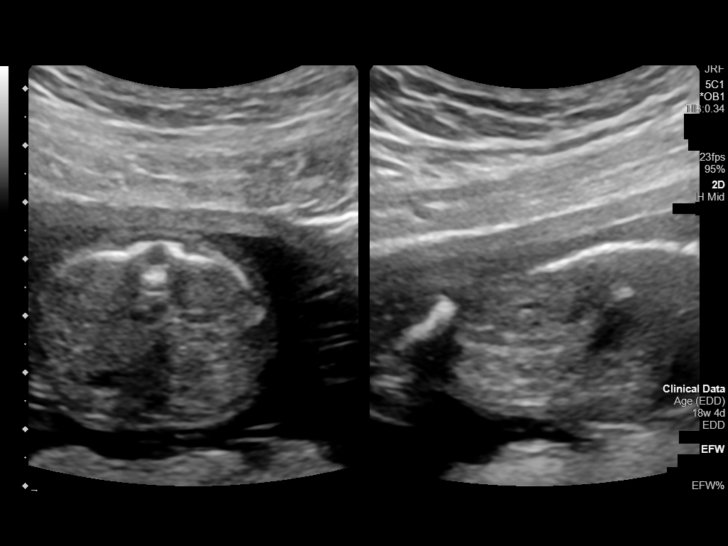
[im 85/96]
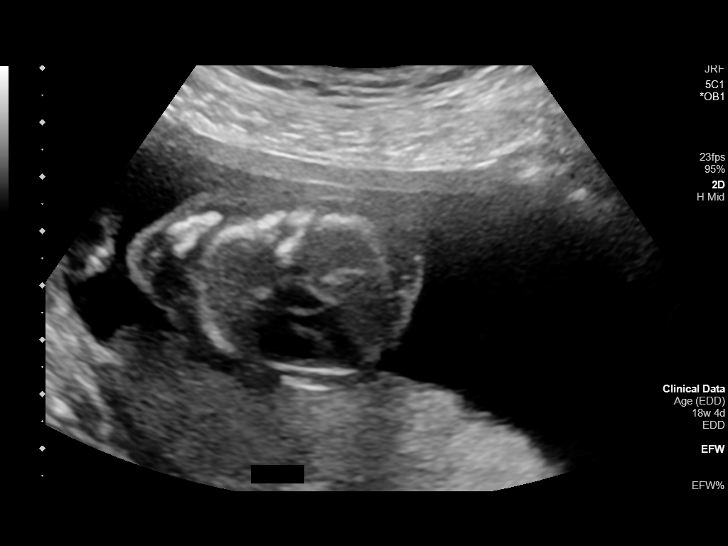
[im 92/96]
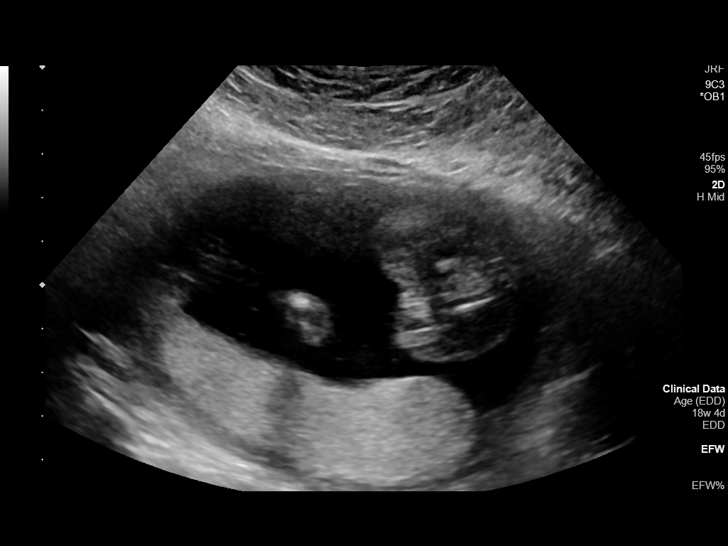

[13 of 28 positions shown; findings below may reference images not displayed]

FINDINGS: Number of Fetuses: 1

Heart Rate:  157 bpm

Movement: Yes

Presentation: Cephalic

Previa: No

Placental Location: Posterior

Amniotic Fluid (Subjective): Within normal limits

Amniotic Fluid (Objective):

Vertical pocket = 3.9cm

FETAL BIOMETRY

BPD: 4.4cm 19w 2d

HC:   15.6cm 18w 6d

AC:   14.1cm 19w 3d

FL:   2.4cm 18w 6d

Current Mean GA: 19w 0d US EDC: 09/10/2018

FETAL ANATOMY

Lateral Ventricles: Appears normal

Thalami/CSP: Appears normal

Posterior Fossa:  Appears normal

Nuchal Region: Appears normal   NFT= 4 mm

Upper Lip: Appears normal

Spine: Appears normal

4 Chamber Heart on Left: Appears normal

LVOT: Appears normal

RVOT: Appears normal

Stomach on Left: Appears normal

3 Vessel Cord: Appears normal

Cord Insertion site: Appears normal

Kidneys: Appears normal

Bladder: Appears normal

Extremities: Appears normal

Technically difficult due to: Fetal position

Maternal Findings:

Cervix:  5.1 cm TA
IMPRESSION: Single living intrauterine fetus with mean gestational age of 19
weeks 0 days, and US EDC of 09/10/2018.

Normal visualized fetal anatomy.  No anomalies identified.

## 2022-03-05 ENCOUNTER — Ambulatory Visit
Admission: EM | Admit: 2022-03-05 | Discharge: 2022-03-05 | Disposition: A | Discharge status: Home / Self Care | Payer: Medicaid Other | Attending: Internal Medicine | Admitting: Internal Medicine

## 2022-03-05 ENCOUNTER — Encounter: Payer: Self-pay | Admitting: Emergency Medicine

## 2022-03-05 DIAGNOSIS — K644 Residual hemorrhoidal skin tags: Secondary | ICD-10-CM | POA: Diagnosis not present

## 2022-03-05 MED ORDER — HYDROCORTISONE ACETATE 25 MG RE SUPP: 25.0000 mg | Freq: Three times a day (TID) | RECTAL | 0 refills | Status: AC

## 2022-03-05 NOTE — ED Triage Notes (Signed)
Pt c/o hemorrhoids. Started about a week ago. She has tried Tucks and preparation H. She states it was helping at first but has stopped helping. Denies rectal bleeding.

## 2022-03-05 NOTE — ED Provider Notes (Signed)
[facility]    CSN: [id number] Arrival date & time: 03/05/22  1721      History   Chief Complaint Chief Complaint  Patient presents with   Hemorrhoids    HPI Aimee Bowers is a 31 y.o. female who presents with flair of hemorhoids x 1 week. Has tried ticks and preparion H which helped initially, but not any more. Denies rectal bleeding. She had been constipated about 3 weeks ago when she fist started having anal pain, and been taking Miralax. Her husband looked at her and noticed something sticking out of her rectum.      History reviewed. No pertinent past medical history.  There are no problems to display for this patient.   History reviewed. No pertinent surgical history.  OB History     Gravida  1   Para      Term      Preterm      AB      Living         SAB      IAB      Ectopic      Multiple      Live Births               Home Medications    Prior to Admission medications   Medication Sig Start Date End Date Taking? Authorizing Provider  hydrocortisone (ANUSOL-HC) 25 MG suppository Place 1 suppository (25 mg total) rectally 3 (three) times daily. 03/05/22  Yes Rodriguez-Southworth, Sunday Spillers, PA-C    Family History No family history on file.  Social History Social History   Tobacco Use   Smoking status: Never   Smokeless tobacco: Never  Vaping Use   Vaping Use: Never used  Substance Use Topics   Alcohol use: No   Drug use: Never     Allergies   Patient has no known allergies.   Review of Systems Review of Systems  Gastrointestinal:  Positive for constipation. Negative for abdominal pain, anal bleeding, blood in stool and diarrhea.       + rectal pain     Physical Exam Triage Vital Signs ED Triage Vitals  Enc Vitals Group     BP 03/05/22 1754 119/83     Pulse Rate 03/05/22 1754 (!) 101     Resp 03/05/22 1754 16     Temp 03/05/22 1754 99 F (37.2 C)     Temp Source 03/05/22 1754 Oral     SpO2  03/05/22 1754 97 %     Weight 03/05/22 1753 143 lb 1.3 oz (64.9 kg)     Height 03/05/22 1753 4\' 11"  (1.499 m)     Head Circumference --      Peak Flow --      Pain Score 03/05/22 1752 8     Pain Loc --      Pain Edu? --      Excl. in [place]? --    No data found.  Updated Vital Signs BP 119/83 (BP Location: Right Arm)   Pulse (!) 101   Temp 99 F (37.2 C) (Oral)   Resp 16   Ht 4\' 11"  (1.499 m)   Wt 143 lb 1.3 oz (64.9 kg)   SpO2 97%   Breastfeeding No   BMI 28.90 kg/m   Visual Acuity Right Eye Distance:   Left Eye Distance:   Bilateral Distance:    Right Eye Near:   Left Eye Near:    Bilateral Near:  Physical Exam Vitals and nursing note reviewed.  Constitutional:      General: She is not in acute distress.    Appearance: She is not toxic-appearing.  Eyes:     General: No scleral icterus.    Conjunctiva/sclera: Conjunctivae normal.  Pulmonary:     Effort: Pulmonary effort is normal.  Genitourinary:    Comments: Has non thrombosed small external hemorrhoid at 6 o'clock, no fissures noted.  Musculoskeletal:        General: Normal range of motion.     Cervical back: Neck supple.  Skin:    General: Skin is warm and dry.     Findings: No rash.  Neurological:     Mental Status: She is alert and oriented to person, place, and time.     Gait: Gait normal.  Psychiatric:        Mood and Affect: Mood normal.        Behavior: Behavior normal.        Thought Content: Thought content normal.        Judgment: Judgment normal.      UC Treatments / Results  Labs (all labs ordered are listed, but only abnormal results are displayed) Labs Reviewed - No data to display  EKG   Radiology No results found.  Procedures Procedures (including critical care time)  Medications Ordered in UC Medications - No data to display  Initial Impression / Assessment and Plan / UC Course  I have reviewed the triage vital signs and the nursing notes.  External hemorrhoid  I  placed her on Anusol suppository as noted Needs to do sits baths 2-3 times a day and avoid getting constipated     Final Clinical Impressions(s) / UC Diagnoses  External hemorrhoid Final diagnoses:  External hemorrhoid     Discharge Instructions      If you dont get better, follow up with a colorectal or general surgeon [facility] General surgeon [address] floor,  [place],  [address]   [phone]  If your insurance does no cover the prescription, find preparation H suppositories      ED Prescriptions     Medication Sig Dispense Auth. Provider   hydrocortisone (ANUSOL-HC) 25 MG suppository Place 1 suppository (25 mg total) rectally 3 (three) times daily. 24 suppository Rodriguez-Southworth, Sunday Spillers, PA-C      PDMP not reviewed this encounter.   Shelby Mattocks, [place] 03/05/22 1824

## 2022-03-05 NOTE — Discharge Instructions (Signed)
If you dont get better, follow up with a colorectal or general surgeon [facility] surgeon [address] floor,  [place], [place] [address]   [phone]  If your insurance does no cover the prescription, find preparation H suppositories

## 2022-06-19 ENCOUNTER — Other Ambulatory Visit: Payer: Self-pay

## 2022-06-19 ENCOUNTER — Ambulatory Visit
Admission: EM | Admit: 2022-06-19 | Discharge: 2022-06-19 | Disposition: A | Discharge status: Home / Self Care | Payer: Medicaid Other | Attending: Family Medicine | Admitting: Family Medicine

## 2022-06-19 DIAGNOSIS — J0100 Acute maxillary sinusitis, unspecified: Secondary | ICD-10-CM

## 2022-06-19 MED ORDER — AMOXICILLIN-POT CLAVULANATE 875-125 MG PO TABS: 1.0000 | ORAL_TABLET | Freq: Two times a day (BID) | ORAL | 0 refills | Status: AC

## 2022-06-19 NOTE — ED Provider Notes (Signed)
[facility]    CSN: [id number] Arrival date & time: 06/19/22  1606      History   Chief Complaint Chief Complaint  Patient presents with   Nasal Congestion   Otalgia    HPI Aimee Bowers is a 32 y.o. female.   Patient presents for evaluation of nasal congestion, rhinorrhea, postnasal drip, right-sided ear pain and sinus pressure to the head present for 3 weeks.  Endorses that symptoms have worsened and right ear pain has become more intense.  Right ear feels muffled and as if there is fluid inside.  Cough is productive with green sputum, denies shortness of breath and wheeze.  Initially began as a viral illness and was able to manage with Tylenol cold and sinus and over-the-counter nasal sprays which she deemed helpful but symptoms continue to progress.  Tolerating food and liquids.  No known sick contact prior.  History reviewed. No pertinent past medical history.  There are no problems to display for this patient.   History reviewed. No pertinent surgical history.  OB History     Gravida  1   Para      Term      Preterm      AB      Living         SAB      IAB      Ectopic      Multiple      Live Births               Home Medications    Prior to Admission medications   Medication Sig Start Date End Date Taking? Authorizing Provider  Etonogestrel (NEXPLANON [place]) Inject into the skin.   Yes [provider]  Multiple Vitamin (MULTIVITAMIN) tablet Take 1 tablet by mouth daily.   Yes [provider]  hydrocortisone (ANUSOL-HC) 25 MG suppository Place 1 suppository (25 mg total) rectally 3 (three) times daily. 03/05/22   Rodriguez-Southworth, Sunday Spillers, PA-C    Family History History reviewed. No pertinent family history.  Social History Social History   Tobacco Use   Smoking status: Never   Smokeless tobacco: Never  Vaping Use   Vaping Use: Never used  Substance Use Topics   Alcohol use: Yes    Comment:  occasional   Drug use: Never     Allergies   Patient has no known allergies.   Review of Systems Review of Systems  Constitutional: Negative.   HENT:  Positive for congestion, ear pain, postnasal drip and rhinorrhea. Negative for dental problem, drooling, ear discharge, facial swelling, hearing loss, mouth sores, nosebleeds, sinus pressure, sinus pain, sneezing, sore throat, tinnitus, trouble swallowing and voice change.   Respiratory:  Positive for cough. Negative for apnea, choking, chest tightness, shortness of breath, wheezing and stridor.   Gastrointestinal: Negative.   Neurological:  Positive for headaches. Negative for dizziness, tremors, seizures, syncope, facial asymmetry, speech difficulty, weakness, light-headedness and numbness.     Physical Exam Triage Vital Signs ED Triage Vitals  Enc Vitals Group     BP 06/19/22 1645 120/85     Pulse Rate 06/19/22 1645 83     Resp 06/19/22 1645 16     Temp 06/19/22 1645 98.2 F (36.8 C)     Temp Source 06/19/22 1645 Oral     SpO2 06/19/22 1645 100 %     Weight 06/19/22 1641 150 lb (68 kg)     Height 06/19/22 1641 4' 11"$  (1.499 m)  Head Circumference --      Peak Flow --      Pain Score 06/19/22 1641 8     Pain Loc --      Pain Edu? --      Excl. in [place]? --    No data found.  Updated Vital Signs BP 120/85 (BP Location: Left Arm)   Pulse 83   Temp 98.2 F (36.8 C) (Oral)   Resp 16   Ht 4' 11"$  (1.499 m)   Wt 150 lb (68 kg)   LMP 06/05/2022   SpO2 100%   BMI 30.30 kg/m   Visual Acuity Right Eye Distance:   Left Eye Distance:   Bilateral Distance:    Right Eye Near:   Left Eye Near:    Bilateral Near:     Physical Exam Constitutional:      Appearance: Normal appearance.  HENT:     Head: Normocephalic.     Right Ear: Tympanic membrane, ear canal and external ear normal.     Left Ear: Tympanic membrane, ear canal and external ear normal.     Nose: Congestion present.     Right Sinus: Maxillary sinus  tenderness and frontal sinus tenderness present.     Left Sinus: No maxillary sinus tenderness or frontal sinus tenderness.     Mouth/Throat:     Pharynx: Posterior oropharyngeal erythema present. No oropharyngeal exudate.     Tonsils: No tonsillar exudate. 0 on the right. 0 on the left.  Cardiovascular:     Rate and Rhythm: Normal rate and regular rhythm.     Pulses: Normal pulses.     Heart sounds: Normal heart sounds.  Pulmonary:     Effort: Pulmonary effort is normal.     Breath sounds: Normal breath sounds.  Skin:    General: Skin is warm and dry.  Neurological:     Mental Status: She is alert and oriented to person, place, and time. Mental status is at baseline.      UC Treatments / Results  Labs (all labs ordered are listed, but only abnormal results are displayed) Labs Reviewed - No data to display  EKG   Radiology No results found.  Procedures Procedures (including critical care time)  Medications Ordered in UC Medications - No data to display  Initial Impression / Assessment and Plan / UC Course  I have reviewed the triage vital signs and the nursing notes.  Pertinent labs & imaging results that were available during my care of the patient were reviewed by me and considered in my medical decision making (see chart for details).  Acute nonrecurrent maxillary sinusitis  Congestion and sinus tenderness present on exam with mild erythema to the oropharynx, viral testing deferred due to timeline of illness, vital signs are stable and patient is in no signs of distress or toxic, presentation is consistent with a sinusitis and as symptoms have been present for at least 21 days we will provide bacterial coverage, Augmentin prescribed and recommended continue supportive measures, may follow-up with this urgent care as needed if symptoms worsen Final Clinical Impressions(s) / UC Diagnoses   Final diagnoses:  None   Discharge Instructions   None    ED  Prescriptions   None    PDMP not reviewed this encounter.   Hans Eden, NP 06/19/22 [phone]

## 2022-06-19 NOTE — Discharge Instructions (Signed)
Today you are being treated for a sinus infection  Begin Augmentin every morning and every evening for 10 days, ideally you will see improvement in about 48 hours and steady progression from there  May continue nasal sprays and Tylenol sinus mixture if helpful, may attempt in the following below  You can take Tylenol and/or Ibuprofen as needed for fever reduction and pain relief.   For cough: honey 1/2 to 1 teaspoon (you can dilute the honey in water or another fluid).  You can also use guaifenesin and dextromethorphan for cough. You can use a humidifier for chest congestion and cough.  If you don't have a humidifier, you can sit in the bathroom with the hot shower running.      For sore throat: try warm salt water gargles, cepacol lozenges, throat spray, warm tea or water with lemon/honey, popsicles or ice, or OTC cold relief medicine for throat discomfort.   For congestion: take a daily anti-histamine like Zyrtec, Claritin, and a oral decongestant, such as pseudoephedrine.  You can also use Flonase 1-2 sprays in each nostril daily.   It is important to stay hydrated: drink plenty of fluids (water, gatorade/powerade/pedialyte, juices, or teas) to keep your throat moisturized and help further relieve irritation/discomfort.

## 2022-06-19 NOTE — ED Triage Notes (Signed)
3 weeks of nasal congestion. Drainage down back of throat. Right ear pain.
